# Patient Record
Sex: Female | Born: 1950 | Race: White | Hispanic: No | Marital: Married | Smoking: Former smoker
Health system: Southern US, Community
[De-identification: ages and names within clinical notes are randomized; demographics above are authoritative.]

## PROBLEM LIST (undated history)

## (undated) DIAGNOSIS — I10 Essential (primary) hypertension: Secondary | ICD-10-CM

## (undated) DIAGNOSIS — N200 Calculus of kidney: Secondary | ICD-10-CM

## (undated) DIAGNOSIS — C4490 Unspecified malignant neoplasm of skin, unspecified: Secondary | ICD-10-CM

## (undated) DIAGNOSIS — H269 Unspecified cataract: Secondary | ICD-10-CM

## (undated) HISTORY — PX: OTHER SURGICAL HISTORY: SHX169

## (undated) HISTORY — DX: Unspecified malignant neoplasm of skin, unspecified: C44.90

## (undated) HISTORY — PX: FRACTURE SURGERY: SHX138

## (undated) HISTORY — DX: Calculus of kidney: N20.0

## (undated) HISTORY — DX: Unspecified cataract: H26.9

## (undated) HISTORY — PX: TUBAL LIGATION: SHX77

## (undated) HISTORY — PX: APPENDECTOMY: SHX54

## (undated) HISTORY — DX: Essential (primary) hypertension: I10

---

## 1984-10-29 DIAGNOSIS — N200 Calculus of kidney: Secondary | ICD-10-CM

## 1984-10-29 HISTORY — DX: Calculus of kidney: N20.0

## 2000-06-18 ENCOUNTER — Emergency Department (HOSPITAL_COMMUNITY): Admission: EM | Admit: 2000-06-18 | Discharge: 2000-06-18 | Payer: Self-pay

## 2002-09-23 ENCOUNTER — Other Ambulatory Visit: Admission: RE | Admit: 2002-09-23 | Discharge: 2002-09-23 | Payer: Self-pay | Admitting: Obstetrics and Gynecology

## 2002-10-07 ENCOUNTER — Encounter: Payer: Self-pay | Admitting: Obstetrics and Gynecology

## 2002-10-07 ENCOUNTER — Encounter: Admission: RE | Admit: 2002-10-07 | Discharge: 2002-10-07 | Payer: Self-pay | Admitting: Obstetrics and Gynecology

## 2004-06-07 ENCOUNTER — Other Ambulatory Visit: Admission: RE | Admit: 2004-06-07 | Discharge: 2004-06-07 | Payer: Self-pay | Admitting: Obstetrics and Gynecology

## 2004-06-09 ENCOUNTER — Encounter: Admission: RE | Admit: 2004-06-09 | Discharge: 2004-06-09 | Payer: Self-pay | Admitting: Obstetrics and Gynecology

## 2004-09-15 ENCOUNTER — Ambulatory Visit (HOSPITAL_COMMUNITY): Admission: RE | Admit: 2004-09-15 | Discharge: 2004-09-15 | Payer: Self-pay | Admitting: Gastroenterology

## 2010-03-16 ENCOUNTER — Observation Stay (HOSPITAL_COMMUNITY): Admission: EM | Admit: 2010-03-16 | Discharge: 2010-03-16 | Payer: Self-pay | Admitting: Emergency Medicine

## 2010-11-18 ENCOUNTER — Encounter: Payer: Self-pay | Admitting: Obstetrics and Gynecology

## 2010-11-19 ENCOUNTER — Encounter: Payer: Self-pay | Admitting: Obstetrics and Gynecology

## 2011-01-15 LAB — BASIC METABOLIC PANEL
Chloride: 102 mEq/L (ref 96–112)
GFR calc Af Amer: >60 mL/min (ref >60)
Potassium: 4 mEq/L (ref 3.5–5.1)

## 2011-01-15 LAB — DIFFERENTIAL
Basophils Relative: 1 % (ref 0–1)
Eosinophils Absolute: 0.4 10*3/uL (ref 0.0–0.7)
Lymphs Abs: 2.9 10*3/uL (ref 0.7–4.0)
Monocytes Relative: 6 % (ref 3–12)
Neutro Abs: 7.7 10*3/uL (ref 1.7–7.7)

## 2011-01-15 LAB — CBC
Hemoglobin: 13.4 g/dL (ref 12.0–15.0)
MCHC: 33.7 g/dL (ref 30.0–36.0)
MCV: 94 fL (ref 78.0–100.0)
Platelets: 287 10*3/uL (ref 150–400)
RBC: 4.21 MIL/uL (ref 3.87–5.11)
RDW: 12.8 % (ref 11.5–15.5)
WBC: 11.8 10*3/uL — ABNORMAL HIGH (ref 4.0–10.5)

## 2011-03-16 NOTE — Op Note (Signed)
Cindy Huber, Cindy Huber                ACCOUNT NO.:  [id number]   MEDICAL RECORD NO.:  [id number]          PATIENT TYPE:  AMB   LOCATION:  ENDO                         FACILITY:  [facility]   PHYSICIAN:  Anselmo Rod, M.D.  DATE OF BIRTH:  October 26, 1951   DATE OF PROCEDURE:  09/15/2004  DATE OF DISCHARGE:                                 OPERATIVE REPORT   PROCEDURE PERFORMED:  Screening colonoscopy.   ENDOSCOPIST:  Anselmo Rod, M.D.   INSTRUMENT USED:  Olympus video colonoscope.   INDICATIONS FOR PROCEDURE:  A 60 year old white female who underwent  screening colonoscopy to rule out colonic polyps, masses, etc.   PREPROCEDURE PREPARATION:  Informed consent was procured from the patient.  The patient was fasted for 8 hours prior to the procedure and prepped with a  bottle of magnesium citrate and a gallon of GoLYTELY the night prior to the  procedure. A 10% miss rate of polyps and cancer was discussed with the  patient.   PREPROCEDURE PHYSICAL:  VITAL SIGNS:  The patient has stable vital signs.  NECK:  Supple.  CHEST:  Clear to auscultation. S1 and S2 regular.  ABDOMEN:  Soft with normal bowel sounds.   DESCRIPTION OF PROCEDURE:  The patient was placed in a left lateral  decubitus position and sedated with 100 mg of Demerol and 7 mg of Versed in  slow incremental doses. Once the patient was adequately sedated and  maintained on low flow oxygen and continuous cardiac monitoring, the Olympus  video colonoscope was advanced from the rectum to the cecum. The appendiceal  orifice and ileocecal valve were clearly visualized and photographed. There  was residual stool in the right colon, and multiple washes were done. No  masses, polyps, erosions, ulcerations, or diverticula was seen. Retroflexion  in the rectum revealed no abnormalities.   IMPRESSION:  Normal colonoscopy to the cecum.   RECOMMENDATIONS:  1.  Continue a high-fiber diet with liberal fluid intake.  2.  Repeat colonoscopy  in the next 5 years unless the patient denies any      abnormal symptoms in the interim.  3.  Outpatient followup as need arises in the future.       JNM/MEDQ  D:  09/15/2004  T:  09/16/2004  Job:  [id number]   cc:   Dois Davenport A. Rivard, M.D.  [address]  [place]  [place] [address]  Fax: [phone]

## 2012-01-22 ENCOUNTER — Ambulatory Visit: Payer: Self-pay | Admitting: Obstetrics and Gynecology

## 2012-02-22 ENCOUNTER — Ambulatory Visit: Payer: Private Health Insurance - Indemnity

## 2012-02-22 ENCOUNTER — Ambulatory Visit (INDEPENDENT_AMBULATORY_CARE_PROVIDER_SITE_OTHER): Payer: Private Health Insurance - Indemnity | Admitting: Obstetrics and Gynecology

## 2012-02-22 ENCOUNTER — Encounter: Payer: Self-pay | Admitting: Obstetrics and Gynecology

## 2012-02-22 VITALS — BP 122/80 | Ht 62.5 in | Wt 156.0 lb

## 2012-02-22 DIAGNOSIS — N951 Menopausal and female climacteric states: Secondary | ICD-10-CM

## 2012-02-22 DIAGNOSIS — Z13820 Encounter for screening for osteoporosis: Secondary | ICD-10-CM

## 2012-02-22 DIAGNOSIS — Z124 Encounter for screening for malignant neoplasm of cervix: Secondary | ICD-10-CM

## 2012-02-22 DIAGNOSIS — Z780 Asymptomatic menopausal state: Secondary | ICD-10-CM

## 2012-02-22 DIAGNOSIS — N200 Calculus of kidney: Secondary | ICD-10-CM | POA: Insufficient documentation

## 2012-02-22 DIAGNOSIS — E785 Hyperlipidemia, unspecified: Secondary | ICD-10-CM

## 2012-02-22 DIAGNOSIS — Z01419 Encounter for gynecological examination (general) (routine) without abnormal findings: Secondary | ICD-10-CM

## 2012-02-22 DIAGNOSIS — I10 Essential (primary) hypertension: Secondary | ICD-10-CM

## 2012-02-22 NOTE — Progress Notes (Signed)
The patient is not taking hormone replacement therapy The patient  is taking a Calcium supplement. Post-menopausal bleeding:no  Last Pap: was normal April  2012 Last mammogram: was normal August  2005 Last DEXA scan : T= unsure    August  2005 Last colonoscopy:was normal November 2005  Urinary symptoms: none Normal bowel movements: Yes Reports abuse at home: No   Subjective:    Cindy Huber is a 61 y.o. female G1P1 who presents for annual exam.  The patient has no complaints today.   The following portions of the patient's history were reviewed and updated as appropriate: allergies, current medications, past family history, past medical history, past social history, past surgical history and problem list.  Review of Systems Pertinent items are noted in HPI. Gastrointestinal:No change in bowel habits, no abdominal pain, no rectal bleeding Genitourinary:negative for dysuria, frequency, hematuria, nocturia and urinary incontinence    Objective:     BP 122/80  Ht 5' 2.5" (1.588 m)  Wt 156 lb (70.761 kg)  BMI 28.08 kg/m2  Weight:  Wt Readings from Last 1 Encounters:  02/22/12 156 lb (70.761 kg)     BMI: Body mass index is 28.08 kg/(m^2). General Appearance: Alert, appropriate appearance for age. No acute distress HEENT: Grossly normal Neck / Thyroid: Supple, no masses, nodes or enlargement Lungs: clear to auscultation bilaterally Back: No CVA tenderness Breast Exam: No masses or nodes.No dimpling, nipple retraction or discharge. Cardiovascular: Regular rate and rhythm. S1, S2, no murmur Gastrointestinal: Soft, non-tender, no masses or organomegaly Pelvic Exam: Vulva and vagina appear normal. Bimanual exam reveals normal uterus and adnexa. Rectovaginal: normal rectal, no masses Lymphatic Exam: Non-palpable nodes in neck, clavicular, axillary, or inguinal regions Skin: no rash or abnormalities Neurologic: Normal gait and speech, no tremor  Psychiatric: Alert and  oriented, appropriate affect.    Urinalysis:Not done      Assessment:    Normal gyn exam    Plan:    Await pap smear results. Mammogram.  DEXA today  Follow-up:  for annual exam

## 2012-02-27 LAB — PAP IG W/ RFLX HPV ASCU

## 2014-08-30 ENCOUNTER — Encounter: Payer: Self-pay | Admitting: Obstetrics and Gynecology

## 2018-05-14 ENCOUNTER — Encounter: Payer: Self-pay | Admitting: Cardiology

## 2018-05-14 ENCOUNTER — Encounter (INDEPENDENT_AMBULATORY_CARE_PROVIDER_SITE_OTHER): Payer: Self-pay

## 2018-05-14 ENCOUNTER — Ambulatory Visit (INDEPENDENT_AMBULATORY_CARE_PROVIDER_SITE_OTHER): Payer: Medicare Other | Admitting: Cardiology

## 2018-05-14 VITALS — BP 144/80 | HR 60 | Ht 62.5 in | Wt 157.0 lb

## 2018-05-14 DIAGNOSIS — E782 Mixed hyperlipidemia: Secondary | ICD-10-CM | POA: Diagnosis not present

## 2018-05-14 DIAGNOSIS — F17210 Nicotine dependence, cigarettes, uncomplicated: Secondary | ICD-10-CM | POA: Diagnosis not present

## 2018-05-14 DIAGNOSIS — R0609 Other forms of dyspnea: Secondary | ICD-10-CM | POA: Insufficient documentation

## 2018-05-14 DIAGNOSIS — I10 Essential (primary) hypertension: Secondary | ICD-10-CM

## 2018-05-14 NOTE — Addendum Note (Signed)
Addended by: Mattie Marlin on: 05/14/2018 10:01 AM   Modules accepted: Orders

## 2018-05-14 NOTE — Patient Instructions (Signed)
Medication Instructions:  Your physician recommends that you continue on your current medications as directed. Please refer to the Current Medication list given to you today.  Labwork: none  Testing/Procedures: Your physician has requested that you have a stress echocardiogram. For further information please visit [email or web address]. Please follow instruction sheet as given.  Follow-Up: Your physician recommends that you schedule a follow-up appointment in: 6 months  Any Other Special Instructions Will Be Listed Below (If Applicable).     If you need a refill on your cardiac medications before your next appointment, please call your pharmacy.   [place], RN, BSN

## 2018-05-14 NOTE — Progress Notes (Signed)
Cardiology Office Note:    Date:  05/14/2018   ID:  Cindy Huber, DOB [birth year], MRN [id number]  PCP:  Blase Mess, PA-C  Cardiologist:  Jenean Lindau, MD   Referring MD: Blase Mess, PA-C    ASSESSMENT:    1. DOE (dyspnea on exertion)   2. Essential hypertension   3. Mixed hyperlipidemia   4. Cigarette smoker    PLAN:    In order of problems listed above:  1. Primary prevention stressed with the patient.  Importance of compliance with diet and medications stressed and she vocalized understanding.  Her blood pressure is stable. 2. I spent 5 minutes with the patient discussing solely about smoking. Smoking cessation was counseled. I suggested to the patient also different medications and pharmacological interventions. Patient is keen to try stopping on its own at this time. He will get back to me if he needs any further assistance in this matter. 3. Her heart rate is on the slower side and to some extent I think is because of her beta-blocker therapy.  It might be changed to any other antihypertensive medication but she is not having any issues with it at this time. 4. I told her that in view of her multiple risk factors for coronary artery disease and shortness of breath on exertion I will do a exercise stress echo.  We will also get her blood work from her primary care physician to assess if she has had a TSH done in view of her bradycardia. 5. Patient will be seen in follow-up appointment in 6 months or earlier if the patient has any concerns    Medication Adjustments/Labs and Tests Ordered: Current medicines are reviewed at length with the patient today.  Concerns regarding medicines are outlined above.  No orders of the defined types were placed in this encounter.  No orders of the defined types were placed in this encounter.    History of Present Illness:    Cindy Huber is a 67 y.o. female who is being seen today for the evaluation of  bradycardia and shortness of breath on exertion at the request of Blase Mess, [place].  Patient is a pleasant 67 year old female.  She has past medical history of essential hypertension, dyslipidemia and is a chronic heavy smoker with smoking up to a pack a day since young age.  She leads a sedentary lifestyle.  She has some shortness of breath on exertion.  She was referred here for bradycardia.  At the time of my evaluation, the patient is alert awake oriented and in no distress.  She denies any chest pain orthopnea or PND.  No history of syncope.  Past Medical History:  Diagnosis Date  . Kidney stone 1986    Past Surgical History:  Procedure Laterality Date  . APPENDECTOMY    . TUBAL LIGATION      Current Medications: Current Meds  Medication Sig  . Ascorbic Acid (VITAMIN C CR) 1500 MG TBCR Take by mouth once.  Marland Kitchen atorvastatin (LIPITOR) 20 MG tablet Take by mouth.  . calcium citrate-vitamin D (CITRACAL+D) 315-200 MG-UNIT per tablet Take 1 tablet by mouth 2 (two) times daily.  . metoprolol tartrate (LOPRESSOR) 50 MG tablet Take by mouth.  . Multiple Vitamins-Minerals (MULTI FOR HER 50+ PO) Take by mouth once.     Allergies:   Patient has no known allergies.   Social History   Socioeconomic History  . Marital status: Divorced    Spouse name: Not  on file  . Number of children: Not on file  . Years of education: Not on file  . Highest education level: Not on file  Occupational History  . Not on file  Social Needs  . Financial resource strain: Not on file  . Food insecurity:    Worry: Not on file    Inability: Not on file  . Transportation needs:    Medical: Not on file    Non-medical: Not on file  Tobacco Use  . Smoking status: Current Every Day Smoker    Packs/day: 0.25    Years: 10.00    Pack years: 2.50    Types: Cigarettes  . Smokeless tobacco: Never Used  Substance and Sexual Activity  . Alcohol use: No  . Drug use: No  . Sexual activity: Yes    Birth  control/protection: None  Lifestyle  . Physical activity:    Days per week: Not on file    Minutes per session: Not on file  . Stress: Not on file  Relationships  . Social connections:    Talks on phone: Not on file    Gets together: Not on file    Attends religious service: Not on file    Active member of club or organization: Not on file    Attends meetings of clubs or organizations: Not on file    Relationship status: Not on file  Other Topics Concern  . Not on file  Social History Narrative  . Not on file     Family History: The patient's family history includes Alcohol abuse in her mother; Gout in her brother; Heart disease in her father; Prostate cancer in her brother; Stroke (age of onset: 43) in her father; Vascular Disease in her sister.  ROS:   Please see the history of present illness.    All other systems reviewed and are negative.  EKGs/Labs/Other Studies Reviewed:    The following studies were reviewed today: EKG reveals sinus bradycardia and nonspecific ST-T changes.   Recent Labs: No results found for requested labs within last 8760 hours.  Recent Lipid Panel No results found for: CHOL, TRIG, HDL, CHOLHDL, VLDL, LDLCALC, LDLDIRECT  Physical Exam:    VS:  BP (!) 144/80 (BP Location: Left Arm, Patient Position: Sitting, Cuff Size: Normal)   Pulse 60   Ht 5' 2.5" (1.588 m)   Wt 157 lb (71.2 kg)   SpO2 98%   BMI 28.26 kg/m     Wt Readings from Last 3 Encounters:  05/14/18 157 lb (71.2 kg)  02/22/12 156 lb (70.8 kg)     GEN: Patient is in no acute distress HEENT: Normal NECK: No JVD; No carotid bruits LYMPHATICS: No lymphadenopathy CARDIAC: S1 S2 regular, 2/6 systolic murmur at the apex. RESPIRATORY:  Clear to auscultation without rales, wheezing or rhonchi  ABDOMEN: Soft, non-tender, non-distended MUSCULOSKELETAL:  No edema; No deformity  SKIN: Warm and dry NEUROLOGIC:  Alert and oriented x 3 PSYCHIATRIC:  Normal affect    Signed, Jenean Lindau, MD  05/14/2018 9:52 AM    [place]

## 2018-05-30 ENCOUNTER — Other Ambulatory Visit: Payer: Medicare Other

## 2019-12-06 ENCOUNTER — Ambulatory Visit: Payer: Medicare Other

## 2019-12-25 ENCOUNTER — Ambulatory Visit: Payer: Medicare Other

## 2019-12-31 ENCOUNTER — Ambulatory Visit: Payer: Medicare Other | Attending: Internal Medicine

## 2019-12-31 DIAGNOSIS — Z23 Encounter for immunization: Secondary | ICD-10-CM

## 2019-12-31 NOTE — Progress Notes (Signed)
   Covid-19 Vaccination Clinic  Name:  Cindy Huber    MRN: [id number] DOB: [birth year]  12/31/2019  Ms. Cindy Huber was observed post Covid-19 immunization for 15 minutes without incident. She was provided with Vaccine Information Sheet and instruction to access the V-Safe system.   Ms. Cindy Huber was instructed to call 911 with any severe reactions post vaccine: Marland Kitchen Difficulty breathing  . Swelling of face and throat  . A fast heartbeat  . A bad rash all over body  . Dizziness and weakness   Immunizations Administered    Name Date Dose VIS Date Route   Pfizer COVID-19 Vaccine 12/31/2019  2:28 PM 0.3 mL 10/09/2019 Intramuscular   Manufacturer: [place]   Lot: [id number]   [place]: [id number]

## 2020-01-27 ENCOUNTER — Ambulatory Visit: Payer: Medicare Other | Attending: Internal Medicine

## 2020-01-27 DIAGNOSIS — Z23 Encounter for immunization: Secondary | ICD-10-CM

## 2020-01-27 NOTE — Progress Notes (Signed)
   Covid-19 Vaccination Clinic  Name:  Ritamae Hyder    MRN: [id number] DOB: 1950/11/25  01/27/2020  Ms. Renda Rolls was observed post Covid-19 immunization for 15 minutes without incident. She was provided with Vaccine Information Sheet and instruction to access the V-Safe system.   Ms. Renda Rolls was instructed to call 911 with any severe reactions post vaccine: Marland Kitchen Difficulty breathing  . Swelling of face and throat  . A fast heartbeat  . A bad rash all over body  . Dizziness and weakness   Immunizations Administered    Name Date Dose VIS Date Route   Pfizer COVID-19 Vaccine 01/27/2020  1:44 PM 0.3 mL 10/09/2019 Intramuscular   Manufacturer: [employer]   Lot: [id number]   [place]: [id number]

## 2021-03-01 ENCOUNTER — Telehealth: Payer: Self-pay | Admitting: Hematology

## 2021-03-01 NOTE — Telephone Encounter (Signed)
Patient referred by Dr Cletis Athens for Metastatic [facility] Lymph Node with unknown Primary.  Appt made for 03/03/21 Labs 10:45 am [facility]) - Consult 03/07/21 10:00 am [facility])  Dr Burr Medico - If patient does not need Lab Work, please let me know  Thank You

## 2021-03-01 NOTE — Telephone Encounter (Signed)
I have reviewed her chart. Please cancel her lab appointment. Please let me know how I can review her PET scan images. Thanks   Truitt Merle MD

## 2021-03-03 ENCOUNTER — Telehealth: Payer: Self-pay

## 2021-03-03 ENCOUNTER — Inpatient Hospital Stay: Payer: Medicare Other

## 2021-03-03 NOTE — Telephone Encounter (Signed)
I spoke to Ms Cindy Huber regarding coming to [facility] at [facility].  She does not want to change locations.  [place]  I will update Dr Burr Medico.

## 2021-03-06 ENCOUNTER — Telehealth: Payer: Self-pay | Admitting: Hematology

## 2021-03-06 NOTE — Telephone Encounter (Signed)
5/4 Patient referred by Dr Cletis Athens for [facility] Lymph Node w/unknow Primary.  Appt made 03/07/21 Consult 10:00 am - [facility]

## 2021-03-07 ENCOUNTER — Inpatient Hospital Stay: Payer: Medicare Other | Attending: Hematology | Admitting: Hematology

## 2021-03-07 ENCOUNTER — Encounter: Payer: Self-pay | Admitting: Hematology

## 2021-03-07 ENCOUNTER — Other Ambulatory Visit: Payer: Self-pay

## 2021-03-07 DIAGNOSIS — C779 Secondary and unspecified malignant neoplasm of lymph node, unspecified: Secondary | ICD-10-CM

## 2021-03-07 DIAGNOSIS — C771 Secondary and unspecified malignant neoplasm of intrathoracic lymph nodes: Secondary | ICD-10-CM | POA: Diagnosis not present

## 2021-03-07 DIAGNOSIS — F17210 Nicotine dependence, cigarettes, uncomplicated: Secondary | ICD-10-CM

## 2021-03-07 DIAGNOSIS — Z8042 Family history of malignant neoplasm of prostate: Secondary | ICD-10-CM | POA: Diagnosis not present

## 2021-03-07 DIAGNOSIS — C801 Malignant (primary) neoplasm, unspecified: Secondary | ICD-10-CM

## 2021-03-07 NOTE — Progress Notes (Signed)
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 Cletis Athens   HISTORY OF PRESENTING ILLNESS:  Cindy Huber 70 y.o. 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  She was referred by ENT Dr. Gaylyn Cheers.  She presents to the clinic with her niece Cindy Huber today.   She noticed a enlarged lymph node underneath her right mandibular in mid November 2021.  No sore throat, dysphagia, hoarseness, or other new symptoms, no fever or chills.  She was seen by primary care physician and received a course of antibiotics without significant improvement.  She was referred to ENT Dr. Orland Mustard for further work-up.  She underwent a CT neck with contrast on January 19 2021, which showed a enlarged and necrotic lymph nodes in right level 2A measuring 2.6 x 1.7 x 1.3 cm.  No other pathologically enlarged lymph nodes identified.  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  Of note, patient had right face and left abdomen skin cancer removal on November 02, 2020, the slides were reviewed by the same pathologist which showed basal cell carcinoma.  Patient underwent PET scan subsequently, which showed hypermetabolic right level 2A lymph node, measuring 1.0 cm, with SUV 6.3.  No other primary or distant metastasis identified on the PET scan.  Patient has noticed the right upper neck lymph node has decreased in size over time, no other new complains.  She denies any pain, fever, chills, weight loss or other symptoms.  Her  appetite and energy level remains normal.  No weight loss in the past 6 months.  She lives with her husband, retired.  She is long term smoker for 50 years, but has cut down significantly in the past few years.  MEDICAL HISTORY:  Past Medical History:  Diagnosis Date  . Cataract   . Hypertension   . Kidney stone 1986  . Skin cancer    on face    SURGICAL HISTORY: Past Surgical History:  Procedure Laterality Date  . APPENDECTOMY    . cataract surgery    . FRACTURE SURGERY Left    left forearm   . TUBAL LIGATION      SOCIAL HISTORY: Social History   Socioeconomic History  . Marital status: Divorced    Spouse name: Not on file  . Number of children: Not on file  . Years of education: Not on file  . Highest education level: Not on file  Occupational History  . Not on file  Tobacco Use  . Smoking status: Current Every Day Smoker    Packs/day: 0.25    Years: 50.00    Pack years: 12.50    Types: Cigarettes  . Smokeless tobacco: Never Used  . Tobacco comment: trying to quit  Substance and Sexual Activity  . Alcohol use: No  . Drug use: No  . Sexual activity: Yes    Birth control/protection: None  Other Topics Concern  . Not on file  Social History Narrative  . Not on file   Social Determinants of Health   Financial Resource Strain: Not on file  Food Insecurity: Not on file  Transportation Needs: Not on file  Physical Activity: Not on file  Stress: Not on file  Social Connections: Not on file  Intimate Partner Violence: Not on file    FAMILY HISTORY: Family History  Problem Relation Age of Onset  . Heart disease Father   . Stroke Father 19  . Alcohol abuse Mother   . Vascular Disease Sister   . Gout Brother   . Prostate cancer Brother 72    ALLERGIES:  has No Known Allergies.  MEDICATIONS:  Current Outpatient Medications  Medication Sig Dispense Refill  . metoprolol tartrate (LOPRESSOR) 50 MG tablet Take 1 tablet morning and 1/2 tab evening    .  Ascorbic Acid (VITAMIN C CR) 1500 MG TBCR Take by mouth once.    Marland Kitchen atorvastatin (LIPITOR) 20 MG tablet Take by mouth.    Marland Kitchen atorvastatin (LIPITOR) 40 MG tablet Take 1 tablet by mouth daily.    . calcium citrate-vitamin D (CITRACAL+D) 315-200 MG-UNIT per tablet Take 1 tablet by mouth 2 (two) times daily.    . Multiple Vitamins-Minerals (MULTI FOR HER 50+ PO) Take by mouth once.     No current facility-administered medications for this visit.    REVIEW OF SYSTEMS:   Constitutional: Denies fevers, chills or abnormal night sweats Eyes: Denies blurriness of vision, double vision or watery eyes Ears, nose, mouth, throat, and face: Denies mucositis or sore throat Respiratory: Denies cough, dyspnea or wheezes Cardiovascular: Denies palpitation, chest discomfort or lower extremity swelling Gastrointestinal:  Denies nausea, heartburn or change in bowel habits Skin: Denies abnormal skin rashes Lymphatics: Denies new lymphadenopathy or easy bruising Neurological:Denies numbness, tingling or new weaknesses Behavioral/Psych: Mood is stable, no new changes  All other systems were reviewed with the patient and are negative.  PHYSICAL EXAMINATION: ECOG PERFORMANCE STATUS: 0 - Asymptomatic  Vitals:   03/07/21 1012  BP: (!) 176/77  Pulse: (!) 56  Resp: 18  Temp: 98.1 F (36.7 C)  SpO2: 97%   Filed Weights   03/07/21 1012  Weight: 155 lb (70.3 kg)    GENERAL:alert, no distress and comfortable SKIN: skin color, texture, turgor are normal, no rashes or significant lesions EYES: normal, conjunctiva are pink and non-injected, sclera clear OROPHARYNX:no exudate, no erythema and lips, buccal mucosa, and tongue normal  NECK: supple, thyroid normal size, non-tender, there is a 1.0cm size palpable node in right submandibular area, nontender.  No other palpable cervical lymphadenopathy. LUNGS: clear to auscultation and percussion with normal breathing effort HEART: regular rate & rhythm and no murmurs  and no lower extremity edema ABDOMEN:abdomen soft, non-tender and normal bowel sounds Musculoskeletal:no cyanosis of digits and no clubbing  PSYCH: alert & oriented x 3 with fluent speech NEURO: no focal motor/sensory deficits  LABORATORY DATA:  I have reviewed the data as listed CBC Latest Ref Rng & Units 03/15/2010  WBC 4.0 - 10.5 K/uL 11.8(H)  Hemoglobin 12.0 - 15.0 g/dL 13.4  Hematocrit 36.0 - 46.0 % 39.6  Platelets 150 - 400 K/uL 287    RADIOGRAPHIC STUDIES: I have personally reviewed the radiological images as listed and agreed with the findings in the report. No results found.  ASSESSMENT & PLAN:  70 yo female   1.  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 Gaylyn Cheers was negative.  There is questionable squamous cell carcinoma of skin, however the previously removed skin cancer in her right face and abdomen in Jan 2022 were all basal cell carcinoma. That was reviewed by pathology.  This is unlikely metastatic skin cancer.  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  The surgery will be likely cervical lymph node dissection, so she may still has occlude primary tumor after surgery and she would likely needs adjuvant radiation. I think definitive radiation make more sense in her case.  I reviewed the treatment course of radiation and potential side effects from radiation. Pt is interested in treatment and open to either treatment.  -I discussed the role concurrent chemo with radiation.  She does not have high risk features warrant chemo except she is a heavy smoker. If she undergo surgery first, then we will have more accurate pathologic staging.  If she has extracapsular extension, or multiple lymph nodes involvement, then she would benefit from adjuvant chemo.  -After the above extensive discussion, patient voiced good understanding, and agrees with our recommendation.  She agrees to see our radiation oncologist Dr. Isidore Moos who is specialized in treating head neck cancer. -I have called Dr. Gaylyn Cheers last week, he is open to surgery if tumor board recommends. -I will plan to call her after tumor board discussion.  2.  Hypertension -Continue medication  3. Heavy smoking  -I encouraged her to quit smoking completely, she will try   Plan -will discuss her case in our ENT tumor board, regarding surgery versus definitive radiation.  I will call her after tumor board with our recommendation.  No orders of the defined types were placed in this encounter.   All questions were answered. The patient knows to call the clinic with any problems, questions or concerns. I spent 50 minutes counseling the patient face to face. The total time spent in the appointment was 60 minutes and more than 50% was on counseling.     Truitt Merle, MD 03/07/2021 3:18 PM

## 2021-03-08 ENCOUNTER — Other Ambulatory Visit: Payer: Self-pay

## 2021-03-08 ENCOUNTER — Ambulatory Visit
Admission: RE | Admit: 2021-03-08 | Discharge: 2021-03-08 | Disposition: A | Discharge status: Home / Self Care | Payer: Self-pay | Source: Ambulatory Visit | Attending: Radiation Oncology | Admitting: Radiation Oncology

## 2021-03-08 ENCOUNTER — Other Ambulatory Visit: Payer: Self-pay | Admitting: Radiation Oncology

## 2021-03-08 DIAGNOSIS — C779 Secondary and unspecified malignant neoplasm of lymph node, unspecified: Secondary | ICD-10-CM

## 2021-03-09 NOTE — Progress Notes (Signed)
Oncology Nurse Navigator Documentation  Placed introductory call to new referral patient Cindy Huber.   Introduced myself as the H&N oncology nurse navigator that works with Dr. Isidore Moos to whom she has been referred by Dr. Burr Medico She confirmed understanding of referral.  Briefly explained my role as her navigator, provided my contact information.   Confirmed understanding of upcoming appts and [place] location, explained arrival and registration process.  I explained the purpose of a dental evaluation prior to starting RT, indicated she would be contacted by [facility] to arrange an appt. She has recently seen her primary dentist and has had fluoride trays made per her report.   I encouraged her to call with questions/concerns as she moves forward with appts and procedures.    She verbalized understanding of information provided, expressed appreciation for my call.   Navigator Initial Assessment . Employment Status: retired . Currently on FMLA / STD: no . Living Situation: She lives with her husband.  . Support System: family . PCP: Blase Mess PA . PCD: She sees a Pharmacist, community regularly. . Financial Concerns: no . Transportation Needs: no . Sensory Deficits: no . Language Barriers/Interpreter Needed:  no . Ambulation Needs: no . DME Used in Home: no . Psychosocial Needs:  no . Concerns/Needs Understanding Cancer:  addressed/answered by navigator to best of ability . Self-Expressed Needs: no   Harlow Asa RN, BSN, OCN Head & Neck Oncology Nurse [place] at [facility] Phone # [phone]  Fax # [phone]

## 2021-03-10 ENCOUNTER — Ambulatory Visit
Admission: RE | Admit: 2021-03-10 | Discharge: 2021-03-10 | Disposition: A | Discharge status: Home / Self Care | Payer: Medicare Other | Source: Ambulatory Visit | Attending: Radiation Oncology | Admitting: Radiation Oncology

## 2021-03-10 ENCOUNTER — Encounter: Payer: Self-pay | Admitting: General Practice

## 2021-03-10 ENCOUNTER — Encounter: Payer: Self-pay | Admitting: Radiation Oncology

## 2021-03-10 ENCOUNTER — Other Ambulatory Visit: Payer: Self-pay

## 2021-03-10 VITALS — BP 150/67 | HR 62 | Temp 96.7°F | Resp 18 | Ht 62.0 in | Wt 157.2 lb

## 2021-03-10 DIAGNOSIS — C770 Secondary and unspecified malignant neoplasm of lymph nodes of head, face and neck: Secondary | ICD-10-CM

## 2021-03-10 DIAGNOSIS — Z87891 Personal history of nicotine dependence: Secondary | ICD-10-CM | POA: Diagnosis not present

## 2021-03-10 DIAGNOSIS — C801 Malignant (primary) neoplasm, unspecified: Secondary | ICD-10-CM | POA: Diagnosis not present

## 2021-03-10 DIAGNOSIS — C779 Secondary and unspecified malignant neoplasm of lymph node, unspecified: Secondary | ICD-10-CM

## 2021-03-10 DIAGNOSIS — Z79899 Other long term (current) drug therapy: Secondary | ICD-10-CM | POA: Insufficient documentation

## 2021-03-10 DIAGNOSIS — Z8042 Family history of malignant neoplasm of prostate: Secondary | ICD-10-CM | POA: Diagnosis not present

## 2021-03-10 NOTE — Progress Notes (Signed)
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  No sore throat, dysphagia, hoarseness, or other new symptoms, no fever or chills.  She was seen by primary care physician and received a course of antibiotics without significant improvement.  She was referred to ENT Dr. Orland Mustard for further work-up.  She underwent a CT neck with contrast on January 19 2021, which showed a enlarged and necrotic lymph nodes in right level 2A measuring 2.6 x 1.7 x 1.3 cm.  No other pathologically enlarged lymph nodes identified.  PET Scan --02/20/2021   Biopsies revealed:  01/31/2021   Nutrition Status Yes No Comments  Weight changes? []  [x]    Swallowing concerns? []  [x]    PEG? []  [x]     Referrals Yes No Comments  Social Work? [x]  []    Dentistry? [x]  []  Had recent bite wings and impressions taken for fluoride trays  Swallowing therapy? [x]  []    Nutrition? [x]  []    Med/Onc? [x]  []  Dr. Truitt Merle   Safety Issues Yes No Comments  Prior radiation? []  [x]    Pacemaker/ICD? []  [x]    Possible current pregnancy? []  [x]  Postmenopausal  Is the patient on methotrexate? []  [x]     Tobacco/Marijuana/Snuff/ETOH use: Former smoker (70 year smoking history); Quit on Mother's Day 03/05/2021. Denies any smokeless tobacco, recreational drug, or alcohol use.  Past/Anticipated interventions by otolaryngology, if any:  02/27/2021 Dr. Cletis Athens  02/01/2021 Ultrasound-guided biopsy of right neck lymph node  Past/Anticipated interventions by medical oncology, if any:  Under care of Dr. Truitt Merle 03/07/2021 -I have personally reviewed her CT neck, PET scan images, and biopsy results, and discussed with patient and her niece in details today. 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 Gaylyn Cheers was negative.  There is questionable squamous cell carcinoma of skin, however the previously removed skin cancer in her right face and abdomen in Jan 2022 were all basal cell carcinoma. That was reviewed by pathology.  This is unlikely metastatic skin cancer.  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  The surgery will be likely cervical lymph node dissection, so she may still has occlude primary tumor after surgery and she would likely needs adjuvant radiation. I think definitive radiation make more sense in her case.  I reviewed the treatment course of radiation and potential side effects from radiation. Pt is interested in treatment and open to either treatment.  -I discussed the role concurrent chemo with radiation. She does not have high risk features warrant chemo except she is a heavy smoker. If she undergo surgery first, then we will have more accurate pathologic staging.  If she has extracapsular extension, or multiple lymph nodes involvement, then she would benefit from adjuvant chemo.  -After the above extensive discussion, patient voiced good understanding, and agrees with our recommendation.  She agrees to see our radiation oncologist Dr. Isidore Moos who is specialized in treating head neck cancer. -I have called Dr. Gaylyn Cheers last week, he is open to surgery if tumor board recommends. -I will plan to call her after tumor board discussion.  Current Complaints /  other details:  Has received the first 3 Pfizer doses of the COVID-19 vaccine

## 2021-03-10 NOTE — Progress Notes (Signed)
[place] Psychosocial Distress Screening Clinical Social Work  Clinical Social Work was referred by distress screening protocol.  The patient scored a 8 on the Psychosocial Distress Thermometer which indicates moderate distress. Clinical Social Worker contacted patient by phone to assess for distress and other psychosocial needs. "Everything is a lot to take in."  "This is not the first consultation I have been to."  Anticipates scheduling of biopsy with [facility], then will take next steps.  She has a good support system of family and friends.  She appreciates the support provided by treatment team.  Briefly oriented her to Patient and [facility], encouraged her to access [place] as needed for support/resources.    ONCBCN DISTRESS SCREENING 03/10/2021  Screening Type Initial Screening  Distress experienced in past week (1-10) 8  Emotional problem type Nervousness/Anxiety;Adjusting to illness  Information Concerns Type   Physical Problem type Sleep/insomnia  Physician notified of physical symptoms Yes  Referral to clinical psychology No  Referral to clinical social work Yes  Referral to dietition Yes  Referral to financial advocate No  Referral to support programs Yes  Referral to palliative care No    Clinical Social Worker follow up needed: No.   If yes, follow up plan:  Beverely Pace, [place], LCSW Clinical Social Worker Phone:  [phone]

## 2021-03-12 NOTE — Progress Notes (Signed)
Radiation Oncology         [phone] ________________________________  Initial Outpatient Consultation  Name: Cindy Huber MRN: [id number]  Date: 03/10/2021  DOB: 07-25-1951  CC:Morehart, Alvis Lemmings, MD   REFERRING PHYSICIAN: Truitt Merle, MD  DIAGNOSIS: C77.0   ICD-10-CM   1. Metastatic squamous cell carcinoma to lymph node with unknown primary site [facility])  C77.9 Ambulatory referral to Social Work    Ambulatory referral to ENT    Ambulatory referral to Social Work    Ambulatory referral to Dentistry  2. Secondary malignancy of lymph nodes of head, face and neck (HCC)  C77.0    Cancer Staging Metastatic squamous cell carcinoma to lymph node with unknown primary site [facility]) Staging form: Pharynx - P16 Negative Oropharynx, AJCC 8th Edition - Clinical stage from 03/07/2021: Stage Unknown (cTX, cN1, cM0) - Signed by Truitt Merle, MD on 03/07/2021 Stage prefix: Initial diagnosis   CHIEF COMPLAINT: Here to discuss management of metastatic right cervical lymph node cancer with unknown primary  HISTORY OF PRESENT ILLNESS::Cindy Huber is a 70 y.o. female who presented with an enlarging right cervical lymph node in November of 2021.  She presents in clinic today with her niece, [name].  The patient saw Dr. Orland Mustard, who recommended a CT scan for further evaluation.   Pertinent imaging thus far includes: 1. CT scan of neck performed on 01/19/2021 revealing enlarged and necrotic lymph nodes in the right level 2A measuring 2.6 x 1.7 x 1.3 cm. There were no other pathologically enlarged lymph nodes identified. 2. PET scan performed on 02/20/2021 revealing isolated hypermetabolism within the right level 2A lymph node, which was decreased in size when compared to above CT scan. There was no evidence of extra-cervical hypermetabolic metastatic disease.  I have personally reviewed her images.  To my eye, the right tonsil appears slightly hypermetabolic compared to left,  but this was not mentioned in the report.  She will be reviewed next week at ENT tumor board.  She has no known history of squamous cell carcinoma of the skin in the head and neck region.  Biopsy of the right cervical lymph nodes on 01/31/2021 revealed: keratinizing squamous cell carcinoma in a background of fibroadipose tissue containing lymphocytes and plasma cells. A P16 immunostain was positive.   The patient underwent a diagnostic flexible fiberoptic nasopharyngolaryngoscopy on 02/27/2021 under the care of Dr. Cletis Athens, which was essentially unremarkable.   She recently saw medical oncology, Dr. Burr Medico.   Nutrition Status Yes No Comments  Weight changes? []  [x]    Swallowing concerns? []  [x]    PEG? []  [x]     Referrals Yes No Comments  Social Work? [x]  []    Dentistry? [x]  []  Had recent bite wings and impressions taken for fluoride trays  Swallowing therapy? [x]  []    Nutrition? [x]  []    Med/Onc? [x]  []  Dr. Truitt Merle   Safety Issues Yes No Comments  Prior radiation? []  [x]    Pacemaker/ICD? []  [x]    Possible current pregnancy? []  [x]  Postmenopausal  Is the patient on methotrexate? []  [x]     Tobacco/Marijuana/Snuff/ETOH use: Former smoker (18 year smoking history); Quit on Mother's Day 03/05/2021. Denies any smokeless tobacco, recreational drug, or alcohol use.   Her niece is a Copywriter, advertising and reports that the patient recently had static x-rays performed and that her teeth are in good repair  PREVIOUS RADIATION THERAPY: No  PAST MEDICAL HISTORY:  has a past medical history of Cataract, Hypertension, Kidney stone (  1986), and Skin cancer.    PAST SURGICAL HISTORY: Past Surgical History:  Procedure Laterality Date  . APPENDECTOMY    . cataract surgery    . FRACTURE SURGERY Left    left forearm   . TUBAL LIGATION      FAMILY HISTORY: family history includes Alcohol abuse in her mother; Gout in her brother; Heart disease in her father; Prostate cancer (age of onset: 35) in  her brother; Stroke (age of onset: 18) in her father; Vascular Disease in her sister.  SOCIAL HISTORY:  reports that she quit smoking 9 days ago. Her smoking use included cigarettes. She has a 12.50 pack-year smoking history. She has never used smokeless tobacco. She reports that she does not drink alcohol and does not use drugs.  ALLERGIES: Patient has no known allergies.  MEDICATIONS:  Current Outpatient Medications  Medication Sig Dispense Refill  . Ascorbic Acid (VITAMIN C CR) 1500 MG TBCR Take by mouth once.    Marland Kitchen atorvastatin (LIPITOR) 20 MG tablet Take by mouth.    Marland Kitchen atorvastatin (LIPITOR) 40 MG tablet Take 1 tablet by mouth daily.    . calcium citrate-vitamin D (CITRACAL+D) 315-200 MG-UNIT per tablet Take 1 tablet by mouth 2 (two) times daily.    . metoprolol tartrate (LOPRESSOR) 50 MG tablet Take 1 tablet morning and 1/2 tab evening    . Multiple Vitamins-Minerals (MULTI FOR HER 50+ PO) Take by mouth once.     No current facility-administered medications for this encounter.    REVIEW OF SYSTEMS:  Notable for that above.   PHYSICAL EXAM:  height is 5' 2"  (1.575 m) and weight is 157 lb 4 oz (71.3 kg). Her temporal temperature is 96.7 F (35.9 C) (abnormal). Her blood pressure is 150/67 (abnormal) and her pulse is 62. Her respiration is 18 and oxygen saturation is 99%.   General: Alert and oriented, in no acute distress HEENT: Head is normocephalic. Extraocular movements are intact. Oropharynx is notable for no obvious lesions, no thrush.  No oral cavity lesions Neck: Neck is notable for 2.5 cm palpable node level 2 right neck Heart: Regular in rate and rhythm with no murmurs, rubs, or gallops. Chest: Clear to auscultation bilaterally, with no rhonchi, wheezes, or rales. Abdomen: Soft, nontender, nondistended, with no rigidity or guarding. Extremities: No cyanosis or edema. Lymphatics: see Neck Exam Skin: No concerning lesions over neck. Musculoskeletal: symmetric strength and  muscle tone throughout. Neurologic: Cranial nerves II through XII are grossly intact. No obvious focalities. Speech is fluent. Coordination is intact. Psychiatric: Judgment and insight are intact. Affect is appropriate.   ECOG = 1  0 - Asymptomatic (Fully active, able to carry on all predisease activities without restriction)  1 - Symptomatic but completely ambulatory (Restricted in physically strenuous activity but ambulatory and able to carry out work of a light or sedentary nature. For example, light housework, office work)  2 - Symptomatic, <50% in bed during the day (Ambulatory and capable of all self care but unable to carry out any work activities. Up and about more than 50% of waking hours)  3 - Symptomatic, >50% in bed, but not bedbound (Capable of only limited self-care, confined to bed or chair 50% or more of waking hours)  4 - Bedbound (Completely disabled. Cannot carry on any self-care. Totally confined to bed or chair)  5 - Death   Eustace Pen MM, Creech RH, Tormey DC, et al. [phone]). "Toxicity and response criteria of the [facility]". Am. Lenna Sciara.  Clin. Oncol. 5 (6): 649-55   LABORATORY DATA:  Lab Results  Component Value Date   WBC 11.8 (H) 03/15/2010   HGB 13.4 03/15/2010   HCT 39.6 03/15/2010   MCV 94.0 03/15/2010   PLT 287 03/15/2010   CMP     Component Value Date/Time   NA 138 03/15/2010 2003   K 4.0 03/15/2010 2003   CL 102 03/15/2010 2003   CO2 27 03/15/2010 2003   GLUCOSE 98 03/15/2010 2003   BUN 13 03/15/2010 2003   CREATININE 0.81 03/15/2010 2003   CALCIUM 9.4 03/15/2010 2003   GFRNONAA >60 03/15/2010 2003   GFRAA  03/15/2010 2003    >60        The eGFR has been calculated using the MDRD equation. This calculation has not been validated in all clinical situations. eGFR's persistently <60 mL/min signify possible Chronic Kidney Disease.      No results found for: TSH   RADIOGRAPHY: As above   IMPRESSION/PLAN:  This is a  delightful patient with head and neck cancer of unknown primary.  Although her PET scan did not show a clear primary for her cancer, statistically speaking it is most likely arising from the oropharynx.  I have asked our navigator, Anderson Malta, to order EBV on the specimen which can be associated with nasopharyngeal cancers.  The squamous cell carcinoma is p16 positive.  I explained to the patient and her niece that although panendoscopy with directed biopsies does not usually reveal a primary if the PET is clear, I think it is reasonable for her to undergo this procedure as it can help guide targeted treatment, either surgically or radiotherapeutically.  If a primary is revealed she might be a candidate for TORS.  We discussed referring her back to Dr. Gaylyn Cheers for these biopsies and then a referral for TORS if appropriate.  However the patient would like to undergo biopsies with a TORS surgeon at a [place] based practice, in case she is a candidate for surgery.  Therefore I will refer her to the [facility] team. (Although extremely subtle, to my eye, there is mildly increased hypermetabolic activity in the right tonsil on her PET).  If she does undergo surgery (to a known primary and to the neck), she understands that she might need adjuvant radiation therapy.  It is less likely that she would need adjuvant chemotherapy and radiation therapy.  An alternative is to receive definitive radiation therapy to a known primary and to the neck and this would also have an excellent chance of cure.  If the primary is not determined, she is also an excellent candidate for definitive radiation therapy to the oropharynx and neck.  We discussed the potential risks, benefits, and side effects of 6-7 weeks of radiotherapy. We talked in detail about acute and late effects. We discussed that some of the most bothersome acute effects may be mucositis, dysgeusia, salivary changes, skin irritation, hair loss, dehydration, weight loss and  fatigue. We talked about late effects which include but are not necessarily limited to dysphagia, hypothyroidism, nerve injury, vascular injury, spinal cord injury, xerostomia, trismus, neck edema, and potential injury to any of the tissues in the head and neck region. No guarantees of treatment were given. A consent form was signed and placed in the patient's medical record. The patient is enthusiastic about proceeding with treatment. I look forward to participating in the patient's care.    Simulation (treatment planning) will take place once her disposition is sorted out with ENT  and also when she is cleared by dentistry.  We also discussed that the treatment of head and neck cancer is a multidisciplinary process to maximize treatment outcomes and quality of life. For this reason the following referrals have been or will be made:   Medical oncology to discuss chemotherapy (done)   Dentistry for dental evaluation, possible extractions in the radiation fields, and /or advice on reducing risk of cavities, osteoradionecrosis, or other oral issues.   Nutritionist for nutrition support during and after treatment.   Speech language pathology for swallowing and/or speech therapy.   Social work for social support.    Physical therapy due to risk of lymphedema in neck and deconditioning.   Baseline labs including TSH.  On date of service, in total, I spent 65 minutes on this encounter. Patient was seen in person.  __________________________________________   Eppie Gibson, MD  This document serves as a record of services personally performed by Eppie Gibson, MD. It was created on his behalf by Clerance Lav, a trained medical scribe. The creation of this record is based on the scribe's personal observations and the provider's statements to them. This document has been checked and approved by the attending provider.

## 2021-03-13 ENCOUNTER — Telehealth: Payer: Self-pay | Admitting: *Deleted

## 2021-03-13 NOTE — Telephone Encounter (Signed)
CALLED PATIENT TO INFORM OF APPT. WITH DR. Fredricka Bonine ON 03-22-21 - ARRIVAL TIME- 1:30 PM, ADDRESS - [place], [address]LO[address] , PHONE NUMBER [phone], SPOKE WITH PATIENT AND SHE IS AWARE OF THIS APPT.

## 2021-03-14 ENCOUNTER — Encounter: Payer: Self-pay | Admitting: Radiation Oncology

## 2021-03-14 DIAGNOSIS — C770 Secondary and unspecified malignant neoplasm of lymph nodes of head, face and neck: Secondary | ICD-10-CM | POA: Insufficient documentation

## 2021-03-15 ENCOUNTER — Other Ambulatory Visit: Payer: Self-pay

## 2021-03-15 DIAGNOSIS — C770 Secondary and unspecified malignant neoplasm of lymph nodes of head, face and neck: Secondary | ICD-10-CM

## 2021-03-15 DIAGNOSIS — R5381 Other malaise: Secondary | ICD-10-CM

## 2021-03-20 ENCOUNTER — Other Ambulatory Visit: Payer: Self-pay

## 2021-03-20 ENCOUNTER — Ambulatory Visit (INDEPENDENT_AMBULATORY_CARE_PROVIDER_SITE_OTHER): Payer: Medicare Other | Admitting: Dentistry

## 2021-03-20 ENCOUNTER — Encounter (HOSPITAL_COMMUNITY): Payer: Self-pay | Admitting: Dentistry

## 2021-03-20 DIAGNOSIS — K06020 Generalized gingival recession, unspecified: Secondary | ICD-10-CM

## 2021-03-20 DIAGNOSIS — K030 Excessive attrition of teeth: Secondary | ICD-10-CM | POA: Diagnosis not present

## 2021-03-20 DIAGNOSIS — K011 Impacted teeth: Secondary | ICD-10-CM

## 2021-03-20 DIAGNOSIS — K032 Erosion of teeth: Secondary | ICD-10-CM

## 2021-03-20 DIAGNOSIS — Z01818 Encounter for other preprocedural examination: Secondary | ICD-10-CM

## 2021-03-20 DIAGNOSIS — K036 Deposits [accretions] on teeth: Secondary | ICD-10-CM

## 2021-03-20 DIAGNOSIS — K08109 Complete loss of teeth, unspecified cause, unspecified class: Secondary | ICD-10-CM

## 2021-03-20 DIAGNOSIS — C779 Secondary and unspecified malignant neoplasm of lymph node, unspecified: Secondary | ICD-10-CM | POA: Diagnosis not present

## 2021-03-20 NOTE — Progress Notes (Signed)
Department of Dental Medicine     OUTPATIENT CONSULTATION  Service Date:   03/20/2021  Patient Name:   Cindy Huber [place] Date of Birth:   03-Sep-1951 Medical Record Number: [id number]  Referring Provider:               Eppie Gibson, MD  TODAY'S VISIT   Assessment:   . There are no current signs of acute odontogenic infection including abscess, edema or erythema, or suspicious lesion requiring biopsy.  Recommendations:   . No dental intervention indicated prior to starting radiation at this time.  Plan:   . Discuss case with medical team and coordinate treatment as needed.  Marland Kitchen Upper and lower scatter protection devices were made and tried-in today.  Will deliver them to radiation oncology pending the simulation date.  . Follow-up with patient after the completion of radiation therapy.  . Discussed in detail all treatment options and recommendations with the patient and they are agreeable to the plan.    Thank you for consulting with [facility] and for the opportunity to participate in this patient's treatment.  Should you have any questions or concerns, please contact the [facility] at [phone].   PROGRESS NOTE:   COVID-19 SCREENING:  The patient denies symptoms concerning for COVID-19 infection including fever, chills, cough, or newly developed shortness of breath.   HISTORY OF PRESENT ILLNESS: . Cindy Huber is a very pleasant 70 y.o. 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  The patient presents today for a medically necessary dental consultation as part of their pre-head and neck radiation work-up.   DENTAL HISTORY: . The patient reports that she does have a dentist she sees regularly every 6 months.  Her dentist is Dr. Donnelly Huber in [place].  She states that her  last visit was earlier this month for a cleaning, updated Xrays and to have fluoride trays made before she starts radiation.  She currently denies any dental/orofacial pain or sensitivity. . Patient is able to manage oral secretions.  Patient denies dysphagia, odynophagia, dysphonia, SOB and neck pain.  Patient denies fever, rigors and malaise.   CHIEF COMPLAINT:  . Here for a pre-head radiation dental exam.   Patient Active Problem List   Diagnosis Date Noted  . Secondary malignancy of lymph nodes of head, face and neck ([place]) 03/14/2021  . 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 Cigarette smoker 05/14/2018  . DOE (dyspnea on exertion) 05/14/2018  . HTN (hypertension) 02/22/2012  . Hyperlipidemia 02/22/2012  . Menopause 02/22/2012  . Kidney stone    Past Medical History:  Diagnosis Date  . Cataract   . Hypertension   . Kidney stone 1986  . Skin cancer    on face   Past Surgical History:  Procedure Laterality Date  . APPENDECTOMY    . cataract surgery    . FRACTURE SURGERY Left    left forearm   . TUBAL LIGATION     No Known Allergies Current Outpatient Medications  Medication Sig Dispense Refill  . Ascorbic Acid (VITAMIN C CR) 1500 MG TBCR Take by mouth once.    Marland Kitchen atorvastatin (LIPITOR) 20 MG tablet Take by mouth.    Marland Kitchen atorvastatin (LIPITOR) 40 MG tablet Take 1 tablet by mouth daily.    . calcium  citrate-vitamin D (CITRACAL+D) 315-200 MG-UNIT per tablet Take 1 tablet by mouth 2 (two) times daily.    . metoprolol tartrate (LOPRESSOR) 50 MG tablet Take 1 tablet morning and 1/2 tab evening    . Multiple Vitamins-Minerals (MULTI FOR HER 50+ PO) Take by mouth once.     No current facility-administered medications for this visit.    LABS: Lab Results  Component Value Date   WBC 11.8 (H) 03/15/2010   HGB 13.4 03/15/2010   HCT 39.6 03/15/2010   MCV 94.0 03/15/2010   PLT 287 03/15/2010      Component Value  Date/Time   NA 138 03/15/2010 2003   K 4.0 03/15/2010 2003   CL 102 03/15/2010 2003   CO2 27 03/15/2010 2003   GLUCOSE 98 03/15/2010 2003   BUN 13 03/15/2010 2003   CREATININE 0.81 03/15/2010 2003   CALCIUM 9.4 03/15/2010 2003   GFRNONAA >60 03/15/2010 2003   GFRAA  03/15/2010 2003    >60        The eGFR has been calculated using the MDRD equation. This calculation has not been validated in all clinical situations. eGFR's persistently <60 mL/min signify possible Chronic Kidney Disease.   No results found for: INR, PROTIME No results found for: PTT  Social History   Socioeconomic History  . Marital status: Divorced    Spouse name: Not on file  . Number of children: Not on file  . Years of education: Not on file  . Highest education level: Not on file  Occupational History  . Not on file  Tobacco Use  . Smoking status: Former Smoker    Packs/day: 0.25    Years: 50.00    Pack years: 12.50    Types: Cigarettes    Quit date: 03/05/2021    Years since quitting: 0.0  . Smokeless tobacco: Never Used  Vaping Use  . Vaping Use: Never used  Substance and Sexual Activity  . Alcohol use: No  . Drug use: No  . Sexual activity: Yes    Birth control/protection: None  Other Topics Concern  . Not on file  Social History Narrative  . Not on file   Social Determinants of Health   Financial Resource Strain: Not on file  Food Insecurity: Not on file  Transportation Needs: Not on file  Physical Activity: Not on file  Stress: Not on file  Social Connections: Not on file  Intimate Partner Violence: Not on file   Family History  Problem Relation Age of Onset  . Heart disease Father   . Stroke Father 74  . Alcohol abuse Mother   . Vascular Disease Sister   . Gout Brother   . Prostate cancer Brother 73    REVIEW OF SYSTEMS:  . Reviewed with the patient as per HPI. Psych: Patient denies having dental phobia.   VITAL SIGNS: BP (!) 167/76 (BP Location: Right Arm)    Pulse (!) 55   Temp 99.1 F (37.3 C)    PHYSICAL EXAM: General:  Well-developed, comfortable and in no apparent distress. Neurological:  Alert and oriented to person, place and  time. Extraoral:  Facial symmetry present without any edema or erythema.  No swelling or lymphadenopathy.  TMJ asymptomatic without clicks or crepitations. Maximum Interincisal Opening:  45 mm Intraoral:  Soft tissues appear well-perfused and mucous membranes moist.  FOM and vestibules soft and not raised. Oral cavity without mass or lesion. No signs of infection, parulis, sinus tract, edema or erythema evident upon exam.  DENTAL EXAM:  . Hard tissue exam completed and charted. Overall impression:  Good remaining dentition.  Missing teeth, heavily restored dentition: existing restorations, inlays/onlays, crown and bridge work.   Oral hygiene:  Good    Periodontal:  Pink, healthy gingival tissue with blunted papilla.  Localized calculus accumulation.  Generalized gingival recession. Caries:  No clinical caries. Endodontics:   #15 and #19 previously RCT with definitive coronal coverage. Removable/fixed prosthodontics:  Patient denies wearing partial dentures.  All remaining maxillary dentition with full-coverage PFM crowns; #6-#8 is a 3-unit PFM bridge with #7 as pontic.  #18MODL onlay (ceramic) and #20 ceramic onlay. Occlusion:  Non-functional tooth #31.  Balanced overall occlusion. Other findings:  Attrition: #24 and #25 incisal.  Abfraction(s): #20B(V) and #21B(V).   RADIOGRAPHIC EXAM:  . PAN exposed and interpreted.  4 bitewings were interpreted which were sent over from the patient's primary dental office.  Condyles seated bilaterally in fossas.  No evidence of abnormal pathology.  All visualized osseous structures appear WNL.  #17 is impacted and angled distally; non-functional/not clinically visible.  Radiopacities on right and left sides: patient had mask and earrings left on; all structures that needed  to be observed are visible.  Localized mild horizontal bone loss consistent with mild periodontitis vs gingival recession.  Radiographic calculus evident on the mesial surface of tooth #15. Missing teeth, existing restorations, full-coverage crowns/restorative material; all margins appear adequate. #15 and #19 endodontically treated.   ASSESSMENT:  1.  Metastatic SCC of right cervical lymph node with unknown primary site 2.  Pre-radiation dental examination 3.  Missing teeth 4.  Accretions on teeth 5.  Gingivitis (tentative periodontal diagnosis) 6.  Attrition/wear 7.  Abfraction/flexure 8.  Impacted tooth #17 9.  Gingival recession, generalized   PROCEDURES: . The common and significant side effects of radiation therapy to the head and neck were explained and discussed with the patient.  The discussion included side effects of trismus (limited opening), dysgeusia (loss of taste), xerostomia (dry mouth), radiation caries and osteoradionecrosis of the jaw.  I also discussed the importance of maintaining optimal oral hygiene and oral health before, during and after radiation to decrease the risk of developing radiation cavities and the need for any surgery such as extractions after therapy.   . Fabrication and try-in/delivery of upper and lower SPD's.  1. Upper and lower models that were poured up/trimmed by the patient's dentist were brought with her today.  Fabrication of upper and lower scatter protection devices were made off of existing models.  Models were given back to the patient. 2. Upper and lower scatter protection devices were tried in and adjusted as needed.  Polished. 3. SPD's will be delivered to radiation oncology pending the patient's simulation date.  4. Trismus appliance made using patient's baseline MIO (24 sticks).  Leta Speller, DAII demonstrated use of appliance.  Verbal and written postop instructions were given to the patient.   PLAN AND RECOMMENDATIONS: . I  discussed the risks, benefits, and complications of various scenarios with the patient in relationship to their medical and dental conditions, which included systemic infection or other serious issues such as osteoradionecrosis that could potentially occur either before, during or after their anticipated radiation therapy if dental/oral concerns are not addressed.  I explained that if any chronic or acute dental/oral infection(s) are addressed and subsequently not maintained following medical optimization and recovery, their risk of the previously mentioned complications are just as high and could potentially occur postoperatively.  I explained all significant  findings of the dental consultation with the patient including her heavily restored dentition in relation to radiation caries and the recommended care including continuing to see her regular dentist every 4-6 months after radiation to monitor for incipient lesions and to ensure she is maintaining optimal oral health and hygiene.  The patient verbalized understanding of all findings, discussion, and recommendations. . We then discussed various treatment options to include no treatment, multiple extractions with alveoloplasty, pre-prosthetic surgery as indicated, periodontal therapy, dental restorations, root canal therapy, crown and bridge therapy, implant therapy, and replacement of missing teeth as indicated.  The patient verbalized understanding of all options, and currently wishes to continue seeing her primary dentist after radiation for cleanings, updated radiographs, exams and other indicated/routine dental care as needed. . Plan to discuss all findings and recommendations with medical team and coordinate future care as needed.  The patient will return to their regular dentist for routine dental care including replacement of missing teeth as needed, cleanings and exams after radiation is complete.  o All questions and concerns were invited and  addressed.  The patient tolerated today's visit well and departed in stable condition.   I spent in excess of 120 minutes during the conduct of this consultation and >50% of this time involved direct face-to-face encounter for counseling and/or coordination of the patient's care. [place] Benson [place], D.M.D.

## 2021-03-20 NOTE — Patient Instructions (Signed)
[place] Benson [place], D.M.D. Phone: [phone] Fax: [phone]   It was a pleasure seeing you today!  Please refer to the information below regarding your dental visit with [place].  Call if you have any questions or concerns that come up after you leave.   Thank you for letting us provide care for you.  If there is anything we can do for you, please let us know.    RADIATION THERAPY AND INFORMATION REGARDING YOUR TEETH   . XEROSTOMIA (DRY MOUTH):  Your salivary glands may be in the field of radiation.  Radiation may include all or only part of your salivary glands.  This will cause your saliva to dry up, and you will have a dry mouth.  The dry mouth will be for the rest of your life unless your radiation oncologist tells you otherwise.  Your saliva has many functions: 1. It wets your tongue for speaking. 2. It coats your teeth and the inside of your mouth for easier movement. 3. It helps with chewing and swallowing food. 4. It helps clean away harmful acid and toxic products made by the germs in your mouth, therefore it helps prevent cavities. 5. It kills some germs in your mouth and helps to prevent gum disease. 6. It helps to carry flavor to your taste buds.  Once you have lost your saliva, you will be at higher risk for tooth decay and gum disease.    What can be done to help improve your mouth when there's not enough saliva? Marland Kitchen Your dentist may give a recommendation for CLoSYS.  It will not bring back all of your saliva but may bring back some of it.  Also, your saliva may be thick and ropy or white and foamy.  It will not feel like it use to feel. . You will need to swish with water every time your mouth feels dry.  YOU CANNOT suck on any cough drops, mints, lemon drops, candy, vitamin C or any other products.  You cannot use anything other than water to make your mouth feel less dry.  If you want to drink anything else, you have to drink it  all at once and brush afterwards.  Be sure to discuss the details of your diet habits with your dentist or hygienist.   . RADIATION CARIES:  This is decay (cavities) that happens very quickly once your mouth is very dry due to radiation therapy.  Normally, cavities take six months to two years to become a problem.  When you have dry mouth, cavities may take as little as eight weeks to cause you a problem.    o Dental check-ups every two months are necessary as long as you have a dry mouth. Radiation caries typically, but not always, start at your gum line where it is hard to see the cavity.  It is therefore also hard to fill these cavities adequately.  This high rate of cavities happens because your mouth no longer has saliva and therefore the acid made by the germs starts the decay process.  Whenever you eat anything the germs in your mouth change the food into acid.  The acid then burns a small hole in your tooth.  This small hole is the beginning of a cavity.  If this is not treated then it will grow bigger and become a cavity.  The way to avoid this hole getting bigger is to use fluoride every evening as prescribed by your  dentist following your radiation. o NOTE:  You have to make sure that your teeth are very clean before you use the fluoride.  This fluoride in turn will strengthen your teeth and prepare them for another day of fighting acid. o If you develop radiation caries many times, the damage is so large that you will have to have all your teeth removed.  This could be a big problem if some of these teeth are in the field of radiation.  Further details of why this could be a big problem will follow (see Osteoradionecrosis below).   . DYSGEUSIA (LOSS OF TASTE):  This happens to varying degrees once you've had radiation therapy to your jaw region.  Many times taste is not completely lost, but becomes limited.  The loss of taste is mostly due to radiation affecting your taste buds.  However, if  you have no saliva in your mouth to carry the flavor to your taste buds, it would be difficult for your taste buds to taste anything.  That is why using water or a prescription for [place] prior to meals and during meal times may help with some of the taste.  Keep in mind that taste generally returns very slowly over the course of several months or several years after radiation therapy.  Don't give up hope.   . TRISMUS (LIMITED JAW OPENING):  According to your radiation oncologist, your TMJ or jaw joints are going to be partially or fully in the field of radiation.  This means that over time the muscles that help you open and close your mouth may get stiff.  This will potentially result in your not being able to open your mouth wide enough or as wide as you can open it now.    Let me give you an example of how slowly this happens and how unaware people are of it:   . A gentlemen that had radiation therapy two years ago came back to me complaining that bananas are just too large for him to be able to fit them in between his teeth.  He was not able to open wide enough to bite into a banana.  This happens slowly and over a period of time.  What we do to try and prevent this:   1. Your dentist will probably give you a stack of sticks called a trismus exercise device.  This stack will help remind your muscles and your jaw joints to open up to the same distance every day.  Use these sticks every morning when you wake up, or according to the instructions given by your dentist.    2. You must use these sticks for at least one to two years after radiation therapy.  The reason for that is because it happens so slowly and keeps going on for about two years after radiation therapy.  Your hospital dentist will help you monitor your mouth opening and make sure that it's not getting smaller after radiation.  TRISMUS EXERCISES: 1. Using the stack of sticks given to you by your dentist, place the stack in your mouth and  hold onto the other end for support. 2. Leave the sticks in your mouth while holding the other end.  Allow 30 seconds for muscle stretching. 3. Rest for a few seconds. 4. Repeat 3-5 times. 5. This exercise is recommended in the mornings and evenings unless otherwise instructed. 6. The exercise should be done for a period of 2 YEARS after the end of radiation. Marland Kitchen  Your maximum jaw opening should be checked regularly at recall dental visits by your general dentist. . You should report any changes, soreness, or difficulties encountered when doing the exercises to your dentist.   . OSTEORADIONECROSIS (ORN):  This is a condition where your jaw bone after radiation therapy becomes very dry.  It has very little blood supply to keep it alive.  If you develop a cavity that turns into an abscess or an infection, then the jaw bone does not have enough blood supply to help fight the infection.  At this point it is very likely that the infection could cause the death of your jaw bone.  When you have dead bone it has to be removed.  Therefore, you might end up having to have surgery to remove part of your jaw bone, the part of the jaw bone that has been affected.     o Healing is also a problem if you are to have surgery (like a tooth extraction) in the areas where the bone has had radiation therapy.  If you have surgery, you need more blood supply to heal which is not available.  When blood supply and oxygen are not available, there is a chance for the bone to die. o Occasionally, ORN happens on its own with no obvious reason, but this is quite rare.  We believe that patients who continue to smoke and/or drink alcohol have a higher chance of having this problem. o Once your jaw bone has had radiation therapy, if there are any remaining teeth in that area, it is not recommended to have them pulled unless your dentist or oral surgeon is aware of your history of radiation and believes it is safe.  o The risks for ORN  either from infection or spontaneously occurring (with no reason) are life long.   QUESTIONS?  Call our office during office hours at [phone].

## 2021-05-15 ENCOUNTER — Other Ambulatory Visit: Payer: Self-pay | Admitting: Obstetrics and Gynecology

## 2021-05-15 ENCOUNTER — Other Ambulatory Visit: Payer: Self-pay | Admitting: Family Medicine

## 2021-05-15 DIAGNOSIS — Z1231 Encounter for screening mammogram for malignant neoplasm of breast: Secondary | ICD-10-CM

## 2021-05-16 ENCOUNTER — Ambulatory Visit
Admission: RE | Admit: 2021-05-16 | Discharge: 2021-05-16 | Disposition: A | Discharge status: Home / Self Care | Payer: Medicare Other | Source: Ambulatory Visit | Attending: Family Medicine | Admitting: Family Medicine

## 2021-05-16 ENCOUNTER — Other Ambulatory Visit: Payer: Self-pay

## 2021-05-16 DIAGNOSIS — Z1231 Encounter for screening mammogram for malignant neoplasm of breast: Secondary | ICD-10-CM

## 2021-05-18 ENCOUNTER — Other Ambulatory Visit: Payer: Self-pay | Admitting: Family Medicine

## 2021-05-18 DIAGNOSIS — R928 Other abnormal and inconclusive findings on diagnostic imaging of breast: Secondary | ICD-10-CM

## 2021-06-06 ENCOUNTER — Other Ambulatory Visit: Payer: Self-pay

## 2021-06-06 ENCOUNTER — Ambulatory Visit
Admission: RE | Admit: 2021-06-06 | Discharge: 2021-06-06 | Disposition: A | Discharge status: Home / Self Care | Payer: Medicare Other | Source: Ambulatory Visit | Attending: Family Medicine | Admitting: Family Medicine

## 2021-06-06 DIAGNOSIS — R928 Other abnormal and inconclusive findings on diagnostic imaging of breast: Secondary | ICD-10-CM

## 2022-07-09 ENCOUNTER — Other Ambulatory Visit: Payer: Self-pay | Admitting: Obstetrics and Gynecology

## 2022-07-09 DIAGNOSIS — Z13820 Encounter for screening for osteoporosis: Secondary | ICD-10-CM

## 2022-07-09 DIAGNOSIS — Z1231 Encounter for screening mammogram for malignant neoplasm of breast: Secondary | ICD-10-CM

## 2022-12-27 ENCOUNTER — Ambulatory Visit
Admission: RE | Admit: 2022-12-27 | Discharge: 2022-12-27 | Disposition: A | Discharge status: Home / Self Care | Payer: Medicare Other | Source: Ambulatory Visit | Attending: Obstetrics and Gynecology | Admitting: Obstetrics and Gynecology

## 2022-12-27 DIAGNOSIS — Z1231 Encounter for screening mammogram for malignant neoplasm of breast: Secondary | ICD-10-CM

## 2022-12-27 DIAGNOSIS — Z13820 Encounter for screening for osteoporosis: Secondary | ICD-10-CM

## 2022-12-31 ENCOUNTER — Other Ambulatory Visit: Payer: Self-pay | Admitting: Obstetrics and Gynecology

## 2022-12-31 DIAGNOSIS — R928 Other abnormal and inconclusive findings on diagnostic imaging of breast: Secondary | ICD-10-CM

## 2023-01-12 ENCOUNTER — Other Ambulatory Visit: Payer: Self-pay | Admitting: Obstetrics and Gynecology

## 2023-01-12 ENCOUNTER — Ambulatory Visit
Admission: RE | Admit: 2023-01-12 | Discharge: 2023-01-12 | Disposition: A | Discharge status: Home / Self Care | Payer: Medicare Other | Source: Ambulatory Visit | Attending: Obstetrics and Gynecology | Admitting: Obstetrics and Gynecology

## 2023-01-12 ENCOUNTER — Ambulatory Visit
Admission: RE | Admit: 2023-01-12 | Discharge: 2023-01-12 | Disposition: A | Discharge status: Home / Self Care | Payer: Private Health Insurance - Indemnity | Source: Ambulatory Visit | Attending: Obstetrics and Gynecology | Admitting: Obstetrics and Gynecology

## 2023-01-12 DIAGNOSIS — R928 Other abnormal and inconclusive findings on diagnostic imaging of breast: Secondary | ICD-10-CM

## 2023-01-24 ENCOUNTER — Ambulatory Visit
Admission: RE | Admit: 2023-01-24 | Discharge: 2023-01-24 | Disposition: A | Discharge status: Home / Self Care | Payer: Medicare Other | Source: Ambulatory Visit | Attending: Obstetrics and Gynecology | Admitting: Obstetrics and Gynecology

## 2023-01-24 DIAGNOSIS — R928 Other abnormal and inconclusive findings on diagnostic imaging of breast: Secondary | ICD-10-CM

## 2023-01-24 HISTORY — PX: BREAST BIOPSY: SHX20

## 2023-04-18 IMAGING — MG MM DIGITAL DIAGNOSTIC UNILAT*R* W/ TOMO W/ CAD
8 series · 8 of 24 positions shown · non-contrast
Comparison: Previous exam(s).

CLINICAL DATA: 69-year-old female presenting as a recall from
baseline screening for possible right breast asymmetry and right
breast mass.

EXAM:
DIGITAL DIAGNOSTIC UNILATERAL RIGHT MAMMOGRAM WITH TOMOSYNTHESIS AND
CAD; ULTRASOUND RIGHT BREAST LIMITED
TECHNIQUE: Right digital diagnostic mammography and breast tomosynthesis was
performed. The images were evaluated with computer-aided detection.;
Targeted ultrasound examination of the right breast was performed

[R ML synth-2D]
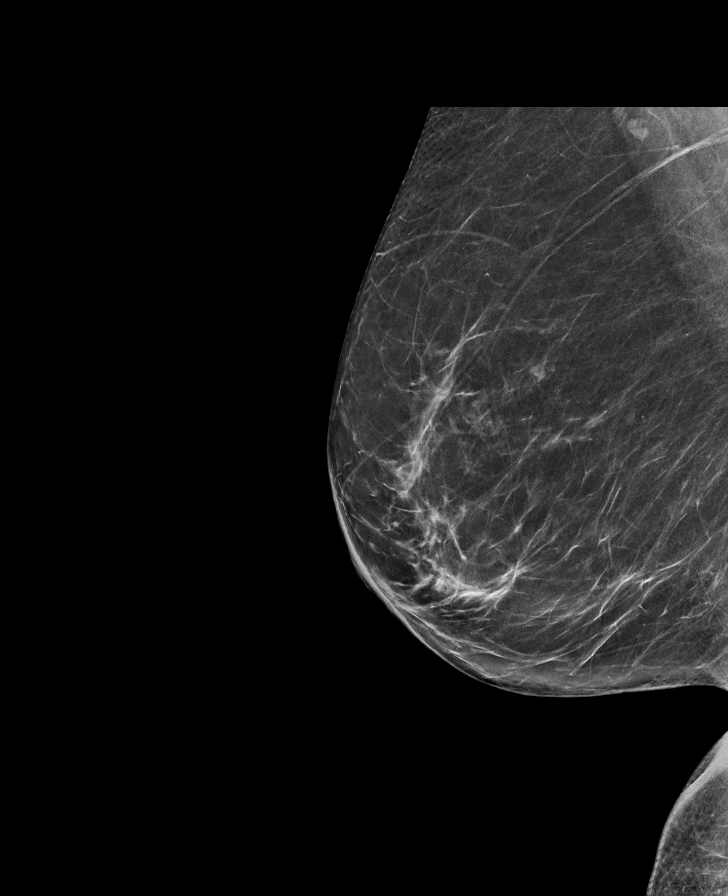

[R CC synth-2D (1 of 2)]
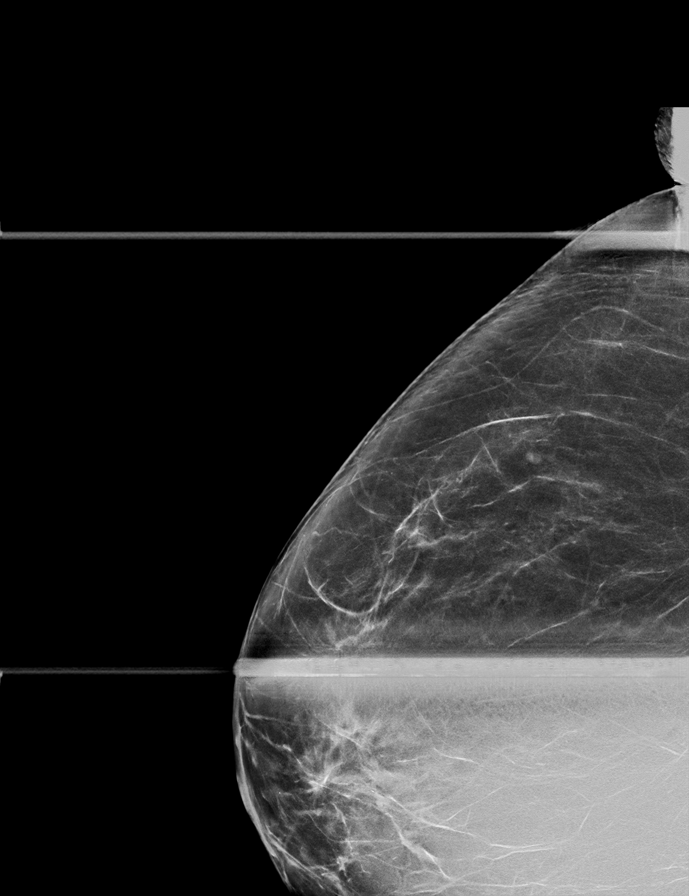

[R MLO synth-2D]
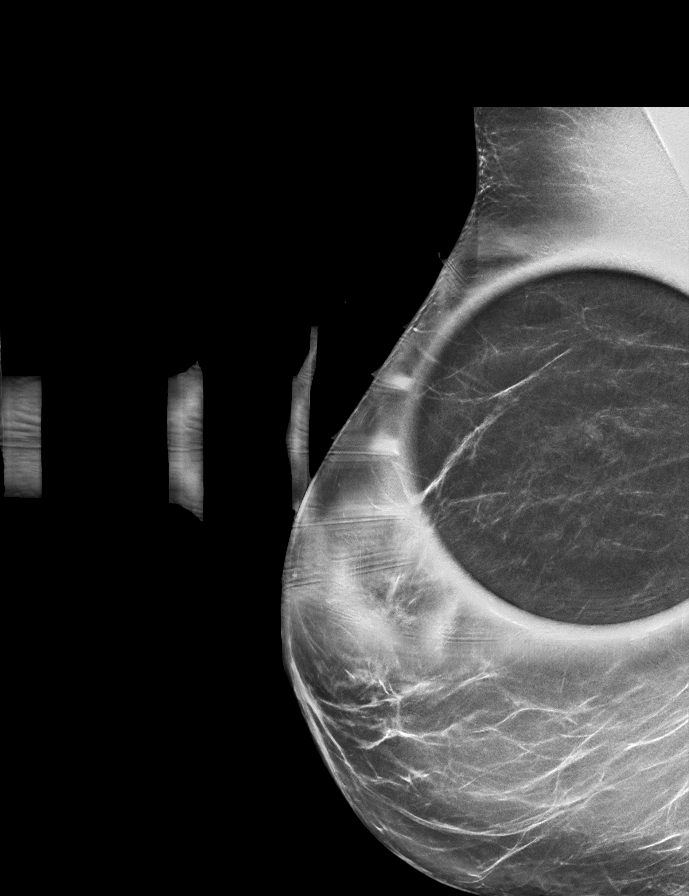

[R CC synth-2D (2 of 2)]
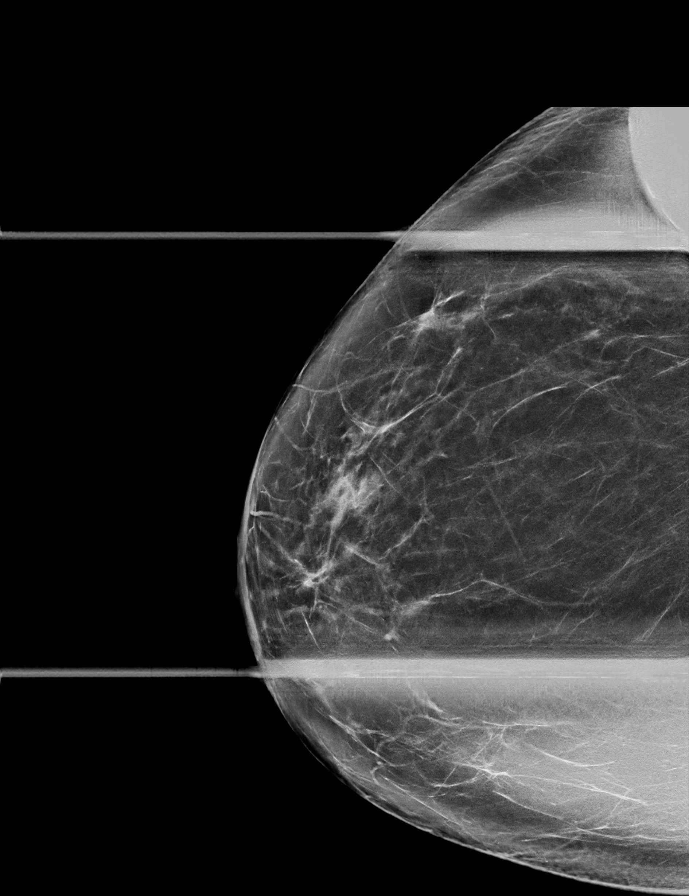

[R CC tomo (1 of 2) · tomo slice 37/72.0]
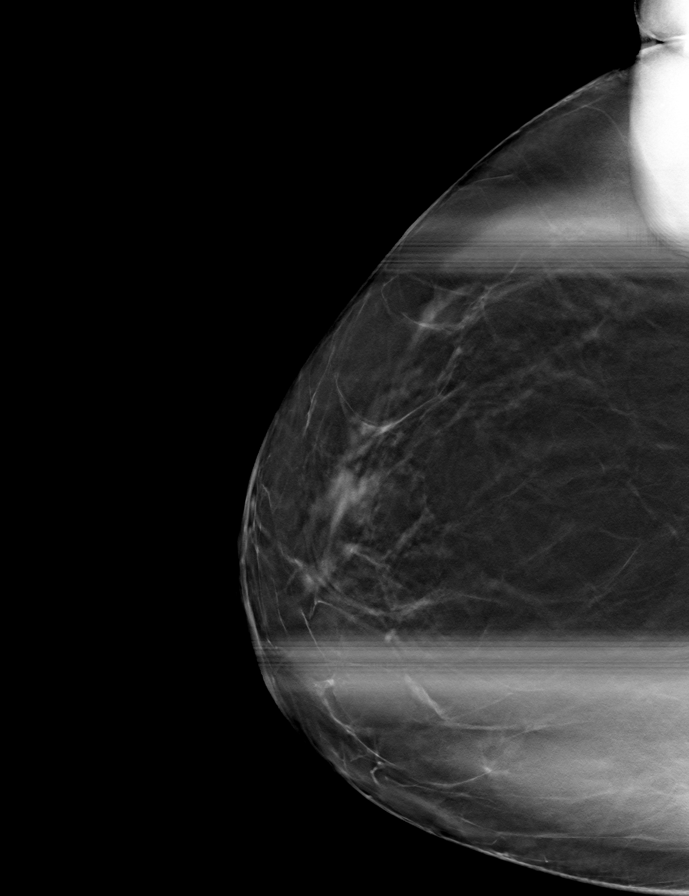

[R MLO tomo · tomo slice 33/66.0]
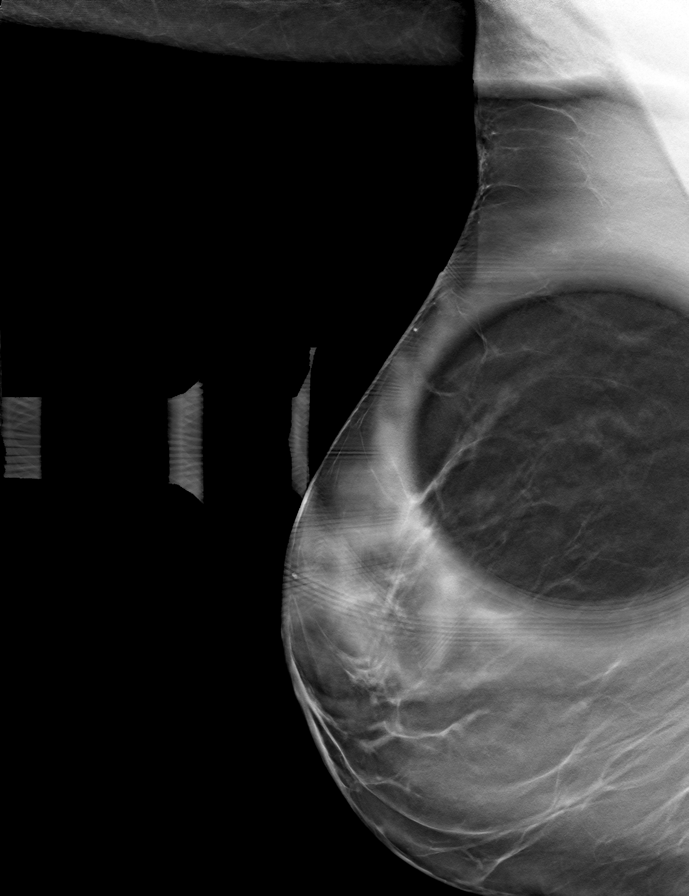

[R ML tomo · tomo slice 37/72.0]
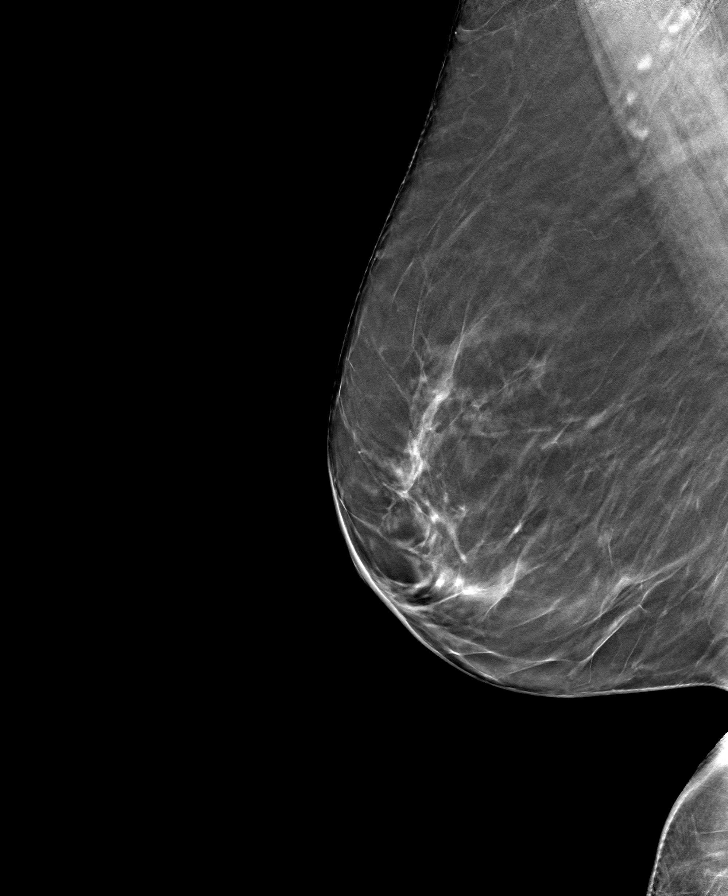

[R CC tomo (2 of 2) · tomo slice 36/71.0]
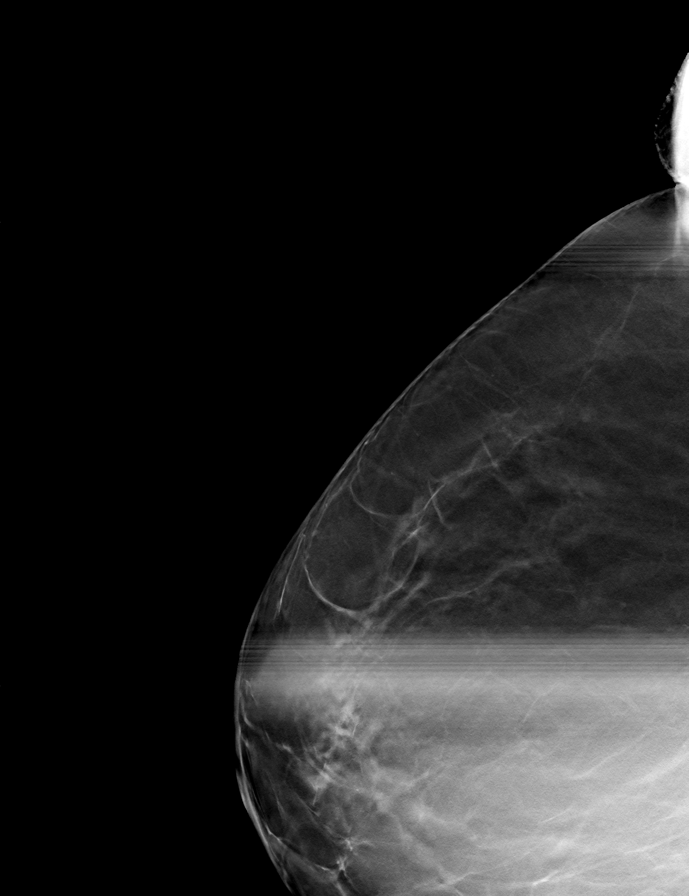

[8 of 24 positions shown; findings below may reference images not displayed]

ACR Breast Density Category b: There are scattered areas of
fibroglandular density.
FINDINGS: Mammogram:

Right breast: Spot compression tomosynthesis cc and MLO as well as
full field mL tomosynthesis views of the right breast were
performed. The questioned asymmetry identified laterally and
anteriorly on the cc view does not persist on the additional
imaging, consistent with normal fibroglandular tissue. There is a
persistent small cluster of masses in the outer right breast.

Ultrasound:

Targeted ultrasound performed in the right breast at 9 o'clock 8 cm
from the nipple demonstrating a benign cluster of cysts measuring
0.6 x 0.2 x 0.3 cm. No solid component. No internal vascularity.
This corresponds to the mammographic finding.
IMPRESSION: Benign cluster of cysts in the right breast at 9 o'clock.

RECOMMENDATION:
Screening mammogram in one year.(Code:[id number]-[id number])

I have discussed the findings and recommendations with the patient.
If applicable, a reminder letter will be sent to the patient
regarding the next appointment.

BI-RADS CATEGORY  2: Benign.

## 2024-04-02 ENCOUNTER — Other Ambulatory Visit: Payer: Self-pay | Admitting: Family Medicine

## 2024-04-02 DIAGNOSIS — Z1231 Encounter for screening mammogram for malignant neoplasm of breast: Secondary | ICD-10-CM

## 2024-04-24 ENCOUNTER — Ambulatory Visit
Admission: RE | Admit: 2024-04-24 | Discharge: 2024-04-24 | Disposition: A | Discharge status: Home / Self Care | Source: Ambulatory Visit | Attending: Family Medicine | Admitting: Family Medicine

## 2024-04-24 DIAGNOSIS — Z1231 Encounter for screening mammogram for malignant neoplasm of breast: Secondary | ICD-10-CM
